# Patient Record
Sex: Male | Born: 1990 | Race: Black or African American | Hispanic: No | Marital: Single | Smoking: Never smoker
Health system: Southern US, Community
[De-identification: ages and names within clinical notes are randomized; demographics above are authoritative.]

## PROBLEM LIST (undated history)

## (undated) HISTORY — PX: EYE SURGERY: SHX253

---

## 2015-03-31 ENCOUNTER — Ambulatory Visit (INDEPENDENT_AMBULATORY_CARE_PROVIDER_SITE_OTHER): Payer: Self-pay | Admitting: Family Medicine

## 2015-03-31 VITALS — BP 120/78 | HR 89 | Temp 98.6°F | Resp 20 | Ht 68.5 in | Wt 146.0 lb

## 2015-03-31 DIAGNOSIS — Z021 Encounter for pre-employment examination: Secondary | ICD-10-CM

## 2015-03-31 DIAGNOSIS — Z111 Encounter for screening for respiratory tuberculosis: Secondary | ICD-10-CM

## 2015-03-31 NOTE — Patient Instructions (Signed)
Eating Healthy on a Budget There are many ways to save money at the grocery store and continue to eat healthy. You can be successful if you plan your meals according to your budget, purchase according to your budget and grocery list, and prepare food yourself.  How can I buy more food on a limited budget? Plan  Plan meals and snacks according to a grocery list and budget you create.  Look for recipes where you can cook once and make enough food for two meals.  Include meals that will "stretch" more expensive foods such as stews, casseroles, and stir-fry dishes.  Make a grocery list and make sure to bring it with you to the store. If you have a smart phone, you could use your phone to create your shopping list. Purchase  When grocery shopping, buy only the items on your grocery list and go only to the areas of the store that have the items on your list. Prepare  Some meal items can be prepared in advance. Pre-cook on days when you have extra time.  Make extra food (such as by doubling recipes) and freeze the extras in meal-sized containers or in individual portions for fast meals and snacks.  Use leftovers in your meal plan for the week.  Try some meatless meals or try "no cook" meals like salads.  When you come home from the grocery store, wash and prepare your fruits and vegetables so they are ready to use and eat. This will help reduce food waste. How can I buy more food on a limited budget?  Try these tips the next time you go shopping:   Buy store brands or generic brands.  Use coupons only for foods and brands you normally buy. Avoid buying items you wouldn't normally buy simply because they are on sale.  Check online and in newspapers for weekly deals.  Buy healthy items from the bulk bins when available, such as herbs, spices, flours, pastas, nuts, and dried fruit.  Buy fruits and vegetables that are in season. Prices are usually lower on in-season produce.  Compare and  contrast different items. You can do this by looking at the unit price on the price tag. Use it to compare different brands and sizes to find out which item is the best deal.  Choose naturally low-cost healthy items, such as carrots, potatoes, apples, bananas, and oranges. Dried or canned beans are a low-cost protein source.  Buy in bulk and freeze extra food. Items you can buy in bulk include meats, fish, poultry, frozen fruits, and frozen vegetables.  Limit the purchase of prepared or "ready-to-eat" foods, such as pre-cut fruits and vegetables and pre-made salads.  If possible, shop around to discover which grocery store offers the best prices. Some stores charge much more than other stores for the same items.  Do not shop when you are hungry. If you shop while hungry, It may be hard to stick to your list and budget.  Stick to your list and resist impulse buys. Treat your list as your official plan for the week.  Buy a variety of vegetables and fruit by purchasing fresh, frozen, and canned items.  Look beyond eye level. Foods at eye level (adult or child eye level) are more expensive. Look at the top and bottom shelves for deals.  Be efficient with your time when shopping. The more time you spend at the store, the more money you are likely to spend.  Consider other retailers such as dollar   stores, larger wholesale stores, local fruit and vegetable stands, and farmers markets. What are some tips for less expensive food substitutions? When choosing more expensive foods like meats and dairy, try these tips to save money:  Choose cheaper cuts of meat, such as bone-in chicken thighs and drumsticks instead skinless and boneless chicken. When you are ready to prepare the chicken, you can remove the skin yourself to make it healthier.  Choose lean meats like chicken or turkey. When choosing ground beef, make sure it is lean ground beef (92% lean, 8% fat). If you do buy a fattier ground beef,  drain the fat before eating.  Buy dried beans and peas, such as lentils, split peas, or kidney beans.  For seafood, choose canned tuna, salmon, or sardines.  Eggs are a low-cost source of protein.  Buy the larger tubs of yogurt instead of individual-sized containers.  Choose water instead of sodas and other sweetened beverages.  Skip buying chips, cookies, and other "junk food". These items are usually expensive, high in calories, and low in nutritional value. How can I prepare the foods I buy in the healthiest way? Practice these tips for cooking foods in the healthiest way to reduce excess fat and calorie intake:  Steam, saute, grill, or bake foods instead of frying them.  Make sure half your plate is filled with fruits or vegetables. Choose from fresh, frozen, or canned fruits and vegetables. If eating canned, remember to rinse them before eating. This will remove any excess salt added for packaging.  Trim all fat from meat before cooking. Remove the skin from chicken or turkey.  Spoon off fat from meat dishes once they have been chilled in the refrigerator and the fat has hardened on the top.  Use skim milk, low-fat milk, or evaporated skim milk when making cream sauces, soups, or puddings.  Substitute low-fat yogurt, sour cream, or cottage cheese for sour cream and mayonnaise in dips and dressings.  Try lemon juice, herbs, or spices to season food instead of salt, butter, or margarine.   This information is not intended to replace advice given to you by your health care provider. Make sure you discuss any questions you have with your health care provider.   Document Released: 11/03/2013 Document Reviewed: 11/03/2013 Elsevier Interactive Patient Education 2016 Elsevier Inc.    

## 2015-03-31 NOTE — Progress Notes (Signed)
Subjective:  By signing my name below, I, Raven Morton, attest that this documentation has been prepared under the direction and in the presence of Daniel SorensonEva Shaw, MD.  Electronically Signed: Andrew Auaven Morton, ED Scribe. 03/31/2015. 1:41 PM.   Patient ID: Daniel Morton, male    DOB: 16-Aug-1990, 25 y.o.   MRN: [id number]  HPI Chief Complaint  Patient presents with  . Annual Exam    tb test   HPI Comments: Daniel Morton is a 25 y.o. male who presents to the [facility] for an annual exam. 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  [place].   PMHx No hx of medical problems. He does not take prescription medication. He had a stye surgically removed from eye, but no other surgeries.   Immunization  Pt is UTD on Tetanus and states he is within 10 years range.  Eye care Pt does not wear contacts or glasses.   Family hx His father has hx of HTN. Mother is healthy   Pt is otherwise health. No complaints or concerns.   History reviewed. No pertinent past medical history. Past Surgical History  Procedure Laterality Date  . Eye surgery     Prior to Admission medications   Not on File   No Known Allergies History reviewed. No pertinent family history. Social History   Social History  . Marital Status: Single    Spouse Name: N/A  . Number of Children: N/A  . Years of Education: N/A   Social History Main Topics  . Smoking status: Never Smoker   . Smokeless tobacco: None  . Alcohol Use: No  . Drug Use: No  . Sexual Activity: Not Asked   Other Topics Concern  . None   Social History Narrative  . None    Review of Systems  All other systems reviewed and are negative.    Objective:   Physical Exam  Constitutional: He is oriented to person, place, and time. He appears well-developed and well-nourished. No distress.  HENT:  Head: Normocephalic and atraumatic.  Right Ear:  Hearing, tympanic membrane, external ear and ear canal normal.  Left Ear: Hearing, tympanic membrane, external ear and ear canal normal.  Nose: Nose normal.  Mouth/Throat: Uvula is midline, oropharynx is clear and moist and mucous membranes are normal.  Eyes: Conjunctivae and EOM are normal.  Neck: Normal range of motion. Neck supple. No thyromegaly present.  Cardiovascular: Normal rate, regular rhythm, S1 normal, S2 normal and normal heart sounds.  Exam reveals no gallop and no friction rub.   No murmur heard. Pulmonary/Chest: Effort normal and breath sounds normal. No respiratory distress. He has no decreased breath sounds. He has no wheezes. He has no rales. He exhibits no tenderness.  Musculoskeletal: Normal range of motion. He exhibits no tenderness.  Lymphadenopathy:    He has no cervical adenopathy.  Neurological: He is alert and oriented to person, place, and time.  Reflex Scores:      Patellar reflexes are 2+ on the right side and 2+ on the left side. Skin: Skin is warm and dry.  Psychiatric: He has a normal mood and affect. His behavior is normal.  Nursing note and vitals reviewed.  Filed Vitals:   03/31/15 1338  BP: 120/78  Pulse: 89  Temp: 98.6 F (37 C)  TempSrc: Oral  Resp: 20  Height: 5' 8.5" (1.74 m)  Weight: 146  lb (66.225 kg)  SpO2: 98%    Assessment & Plan:   1. Pre-employment health screening examination   2. Screening-pulmonary TB   No concerns or restrictions. Completed form for [employer] employment. Immunizations UTD per pt.  CDC screening/sxs questionnaire neg with no risk factors so no need for ppd skin test. (If does need ppd, ok to return for nurse visit for this only w/o OV co-pay)   I personally performed the services described in this documentation, which was scribed in my presence. The recorded information has been reviewed and considered, and addended by me as needed.  Daniel Sorenson, MD MPH

## 2015-03-31 NOTE — Progress Notes (Signed)

## 2015-07-04 ENCOUNTER — Emergency Department (HOSPITAL_COMMUNITY)
Admission: EM | Admit: 2015-07-04 | Discharge: 2015-07-05 | Disposition: A | Discharge status: Home / Self Care | Payer: BC Managed Care – PPO | Attending: Emergency Medicine | Admitting: Emergency Medicine

## 2015-07-04 ENCOUNTER — Emergency Department (HOSPITAL_COMMUNITY): Payer: BC Managed Care – PPO

## 2015-07-04 ENCOUNTER — Encounter (HOSPITAL_COMMUNITY): Payer: Self-pay | Admitting: Emergency Medicine

## 2015-07-04 DIAGNOSIS — F411 Generalized anxiety disorder: Secondary | ICD-10-CM

## 2015-07-04 DIAGNOSIS — F419 Anxiety disorder, unspecified: Secondary | ICD-10-CM | POA: Insufficient documentation

## 2015-07-04 DIAGNOSIS — R079 Chest pain, unspecified: Secondary | ICD-10-CM | POA: Diagnosis not present

## 2015-07-04 LAB — BASIC METABOLIC PANEL
ANION GAP: 13 (ref 5–15)
BUN: 9 mg/dL (ref 6–20)
CHLORIDE: 102 mmol/L (ref 101–111)
CO2: 26 mmol/L (ref 22–32)
Calcium: 9.4 mg/dL (ref 8.9–10.3)
Creatinine, Ser: 1 mg/dL (ref 0.61–1.24)
GFR calc Af Amer: >60 mL/min (ref >60)
GFR calc non Af Amer: >60 mL/min (ref >60)
Glucose, Bld: 113 mg/dL — ABNORMAL HIGH (ref 65–99)
POTASSIUM: 3.8 mmol/L (ref 3.5–5.1)
SODIUM: 141 mmol/L (ref 135–145)

## 2015-07-04 LAB — I-STAT TROPONIN, ED: Troponin i, poc: 0 ng/mL (ref 0.00–0.08)

## 2015-07-04 LAB — CBC
HEMATOCRIT: 44 % (ref 39.0–52.0)
HEMOGLOBIN: 14.2 g/dL (ref 13.0–17.0)
MCH: 26.6 pg (ref 26.0–34.0)
MCHC: 32.3 g/dL (ref 30.0–36.0)
MCV: 82.6 fL (ref 78.0–100.0)
Platelets: 249 10*3/uL (ref 150–400)
RBC: 5.33 MIL/uL (ref 4.22–5.81)
RDW: 12.6 % (ref 11.5–15.5)
WBC: 7.2 10*3/uL (ref 4.0–10.5)

## 2015-07-04 MED ORDER — NAPROXEN 250 MG PO TABS: 500.0000 mg | ORAL_TABLET | Freq: Once | ORAL | Status: DC

## 2015-07-04 NOTE — ED Notes (Signed)
Pt. reports central chest pain with mild SOB onset today , denies nausea or diaphoresis , no cough or congestion .

## 2015-07-04 NOTE — ED Provider Notes (Signed)
CSN: [id number]     Arrival date & time 07/04/15  2046 History   First MD Initiated Contact with Patient 07/04/15 2230     Chief Complaint  Patient presents with  . Chest Pain      HPI  NORVAL SLAVEN is an 25 y.o. otherwise healthy male who presents to the ED for evaluation of chest pain. 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 He states the pain is constant in his central chest. It does not radiate. He states that he currently feels a dull ache which is much improved from yesterday. Denies SOB or diaphoresis. Denies feeling faint or lightheaded. Denies leg pain or swelling. He did not try anything to alleviate his symptoms. Denies cardiac history or h/o blood clots. Denies significant fam hx. Denies smoking or recent travel.  History reviewed. No pertinent past medical history. Past Surgical History  Procedure Laterality Date  . Eye surgery     No family history on file. Social History  Substance Use Topics  . Smoking status: Never Smoker   . Smokeless tobacco: None  . Alcohol Use: No    Review of Systems  All other systems reviewed and are negative.     Allergies  Review of patient's allergies indicates no known allergies.  Home Medications   Prior to Admission medications   Not on File   BP 149/98 mmHg  Pulse 91  Temp(Src) 98.7 F (37.1 C) (Oral)  Resp 13  Ht  (1.727 m)  Wt 68.04 kg  BMI 22.81 kg/m2  SpO2 98% Physical Exam  Constitutional: He is oriented to person, place, and time. No distress.  HENT:  Right Ear: External ear normal.  Left Ear: External ear normal.  Nose: Nose normal.  Mouth/Throat: Oropharynx is clear and moist. No oropharyngeal exudate.  Eyes: Conjunctivae and EOM are normal. Pupils are equal, round, and reactive to light.  Neck: Normal range of motion. Neck supple.  Cardiovascular: Normal rate, regular rhythm, normal heart sounds and intact distal pulses.   Pulmonary/Chest: Effort normal and breath  sounds normal. No respiratory distress. He has no wheezes. He exhibits tenderness.    Abdominal: Soft. Bowel sounds are normal. He exhibits no distension. There is no tenderness. There is no rebound and no guarding.  Musculoskeletal: He exhibits no edema.  Neurological: He is alert and oriented to person, place, and time. No cranial nerve deficit.  Skin: Skin is warm and dry.  Psychiatric: His mood appears anxious.  Nursing note and vitals reviewed.   ED Course  Procedures (including critical care time) Labs Review Labs Reviewed  BASIC METABOLIC PANEL - Abnormal; Notable for the following:    Glucose, Bld 113 (*)    All other components within normal limits  CBC  I-STAT TROPOININ, ED    Imaging Review Dg Chest 2 View  07/04/2015  CLINICAL DATA:  Chest pain since yesterday. EXAM: CHEST  2 VIEW COMPARISON:  None. FINDINGS: The cardiomediastinal contours are normal. The lungs are clear. Pulmonary vasculature is normal. No consolidation, pleural effusion, or pneumothorax. No acute osseous abnormalities are seen. IMPRESSION: No acute pulmonary process. Electronically Signed   By: Rubye Oaks M.D.   On: 07/04/2015 21:31   I have personally reviewed and evaluated these images and lab results as part of my medical decision-making.   EKG Interpretation None      MDM   Final diagnoses:  Chest pain, unspecified chest pain type  Anxiety state  Labs unremarkable. CXR negative. Trop negative. EKG nonacute. Doubt ACS. HEART score 0. Doubt PE. PERC negative. The pain is reproducible with palpation. Suspect chest pain likely secondary to stress/anxiety vs msk pain. Pt endorses feeling nervous in the ED. Will give naproxen for pain and reassess. ANticipate d/c home.   11:56 PM pt stil has not receive naproxen due to multiple acute patients in the pod. He states pain is gone and he would like to go home. VSS. Instructed to f/u with PCP. Strict ER return precautions given.   Carlene CoriaSerena Y  Lilliah Priego, PA-C 07/04/15 2359  Geoffery Lyonsouglas Delo, MD 07/05/15 [phone]

## 2015-07-04 NOTE — Discharge Instructions (Signed)
You were seen in the ER today for evaluation of chest pain. Your labs, EKG, and chest x-ray were normal. I suspect your pain was due to stress. Return to the emergency room for worsening condition or new concerning symptoms. Follow up with your regular doctor. If you don't have a regular doctor use one of the numbers below to establish a primary care doctor.   Emergency Department Resource Guide 1) Find a Doctor and Pay Out of Pocket Although you won't have to find out who is covered by your insurance plan, it is a good idea to ask around and get recommendations. You will then need to call the office and see if the doctor you have chosen will accept you as a new patient and what types of options they offer for patients who are self-pay. Some doctors offer discounts or will set up payment plans for their patients who do not have insurance, but you will need to ask so you aren't surprised when you get to your appointment.  2) Contact Your Local Health Department Not all health departments have doctors that can see patients for sick visits, but many do, so it is worth a call to see if yours does. If you don't know where your local health department is, you can check in your phone book. The CDC also has a tool to help you locate your state's health department, and many state websites also have listings of all of their local health departments.  3) Find a Walk-in Clinic If your illness is not likely to be very severe or complicated, you may want to try a walk in clinic. These are popping up all over the country in pharmacies, drugstores, and shopping centers. They're usually staffed by nurse practitioners or physician assistants that have been trained to treat common illnesses and complaints. They're usually fairly quick and inexpensive. However, if you have serious medical issues or chronic medical problems, these are probably not your best option.  No Primary Care Doctor: - Call Health Connect at   [phone] - they can help you locate a primary care doctor that  accepts your insurance, provides certain services, etc. - Physician Referral [phone]- [phone]  Emergency Department Resource Guide 1) Find a Doctor and Pay Out of Pocket Although you won't have to find out who is covered by your insurance plan, it is a good idea to ask around and get recommendations. You will then need to call the office and see if the doctor you have chosen will accept you as a new patient and what types of options they offer for patients who are self-pay. Some doctors offer discounts or will set up payment plans for their patients who do not have insurance, but you will need to ask so you aren't surprised when you get to your appointment.  2) Contact Your Local Health Department Not all health departments have doctors that can see patients for sick visits, but many do, so it is worth a call to see if yours does. If you don't know where your local health department is, you can check in your phone book. The CDC also has a tool to help you locate your state's health department, and many state websites also have listings of all of their local health departments.  3) Find a Walk-in Clinic If your illness is not likely to be very severe or complicated, you may want to try a walk in clinic. These are popping up all over the country in pharmacies, drugstores, and  shopping centers. They're usually staffed by nurse practitioners or physician assistants that have been trained to treat common illnesses and complaints. They're usually fairly quick and inexpensive. However, if you have serious medical issues or chronic medical problems, these are probably not your best option.  No Primary Care Doctor: - Call Health Connect at  [phone] - they can help you locate a primary care doctor that  accepts your insurance, provides certain services, etc. - Physician Referral Service- [phone]  Chronic Pain  Problems: Organization         Address  Phone   Notes  [facility]  [phone] Patients need to be referred by their primary care doctor.   Medication Assistance: Organization         Address  Phone   Notes  [facility] Medication [facility] [address] [place], [place] [address] [phone] --Must be a resident of [facility] -- Must have NO insurance coverage whatsoever (no Medicaid/ Medicare, etc.) -- The pt. MUST have a primary care doctor that directs their care regularly and follows them in the community   [facility]  [phone]   [employer]  [phone]    Agencies that provide inexpensive medical care: Organization         Address  Phone   Notes  [place]  [phone]   [facility]    [phone]   [facility] [place], [place] [address] [phone]   [place] [address], [place] [phone]   [facility]    [phone]   [facility]    [phone]   [place] and [place] [address], [place] Phone:  [phone], Fax:  [phone] Hours of Operation:  9 am - 6 pm, M-F.  Also accepts Medicaid/Medicare and self-pay.  [facility] for [place], [address], [place] Phone: [phone], Fax: [phone]. Hours of Operation:  8:30 am - 5:30 pm, M-F.  Also accepts Medicaid and self-pay.  [facility] [place] [address] [place] Phone: [phone]   [place], [place], [place] [phone], Ext. [phone] Mondays & Thursdays: 7-9 AM.  First 15 patients are seen on a first come, first serve basis.    [place] Providers:  Organization         Address  Phone   Notes  [facility] [address],  [phone] Also  accepts self-pay patients.  [facility] [address], [place]  [phone]   [place], [address], [place] [phone]   [facility] [address], [place] [phone]   Lucianne Lei [address], [place]   [phone] Only accepts [place] patients after they have their name applied to their card.   Self-Pay (no insurance) in [facility]:  Organization         Address  Phone   Notes  Sickle Cell Patients, [facility] [place] [phone]   [facility] [place] [phone]   [facility] [place]  [place] [address] [place], [place] [phone]  [facility]/Dr. Osei-Bonsu  [address] [place] [address], [place] or [address], [place] [phone] Phone number for both [place] and [place] locations is the same.  [facility] [address], [place] [phone]   [facility] [place] [address] [place] [address], [place] or [address] [phone]   [facility] [address], [place] [phone], phone; [phone], fax Sees patients 1st and 3rd Saturday of every month.  Must not qualify for public or private insurance (i.e. Medicaid, Medicare, [place] Health Choice, Veterans' Benefits)  Household income should be no more than 200% of the poverty level The clinic cannot treat you if you are pregnant or think you are pregnant  Sexually transmitted diseases are not treated at the clinic.

## 2017-02-22 ENCOUNTER — Encounter (HOSPITAL_COMMUNITY): Payer: Self-pay | Admitting: Emergency Medicine

## 2017-02-22 ENCOUNTER — Ambulatory Visit (HOSPITAL_COMMUNITY)
Admission: EM | Admit: 2017-02-22 | Discharge: 2017-02-22 | Disposition: A | Discharge status: Home / Self Care | Payer: BC Managed Care – PPO | Attending: Family Medicine | Admitting: Family Medicine

## 2017-02-22 DIAGNOSIS — R300 Dysuria: Secondary | ICD-10-CM

## 2017-02-22 DIAGNOSIS — R369 Urethral discharge, unspecified: Secondary | ICD-10-CM

## 2017-02-22 LAB — POCT URINALYSIS DIP (DEVICE)
Bilirubin Urine: NEGATIVE
GLUCOSE, UA: NEGATIVE mg/dL
Hgb urine dipstick: NEGATIVE
Ketones, ur: NEGATIVE mg/dL
Leukocytes, UA: NEGATIVE
NITRITE: NEGATIVE
PROTEIN: NEGATIVE mg/dL
SPECIFIC GRAVITY, URINE: 1.015 (ref 1.005–1.030)
UROBILINOGEN UA: 0.2 mg/dL (ref 0.0–1.0)
pH: 8.5 — ABNORMAL HIGH (ref 5.0–8.0)

## 2017-02-22 MED ORDER — CEFTRIAXONE SODIUM 250 MG IJ SOLR
INTRAMUSCULAR | Status: AC
  Filled 2017-02-22: qty 250

## 2017-02-22 MED ORDER — CEFTRIAXONE SODIUM 250 MG IJ SOLR
250.0000 mg | Freq: Once | INTRAMUSCULAR | Status: AC
  Administered 2017-02-22: 250 mg via INTRAMUSCULAR

## 2017-02-22 MED ORDER — LIDOCAINE HCL (PF) 1 % IJ SOLN
INTRAMUSCULAR | Status: AC
  Filled 2017-02-22: qty 2

## 2017-02-22 MED ORDER — AZITHROMYCIN 250 MG PO TABS
ORAL_TABLET | ORAL | Status: AC
  Filled 2017-02-22: qty 4

## 2017-02-22 MED ORDER — AZITHROMYCIN 250 MG PO TABS
1000.0000 mg | ORAL_TABLET | Freq: Once | ORAL | Status: AC
  Administered 2017-02-22: 1000 mg via ORAL

## 2017-02-22 NOTE — ED Triage Notes (Signed)
PT reports dysuria and discharge for 2 days.

## 2017-02-22 NOTE — Discharge Instructions (Signed)
We have treated you for gonorrhea and chlamydia today. We will call you with the results and let you know if you need any further medication.   Please return if your symptoms are not improving, worsening. Monitor the bump for change, pain. May just be an ingrown hair.

## 2017-02-22 NOTE — ED Provider Notes (Signed)
[facility]    CSN: [id number] Arrival date & time: 02/22/17  1238     History   Chief Complaint Chief Complaint  Patient presents with  . Dysuria    HPI Daniel Morton is a 26 y.o. male presenting with 2 days of dysuria and discharge. Admits to having oral sex, sexual intercourse. Previously had similar symptoms and tested positive for STD's. Denies lesions to penis does endorse a hard bump on his left buttocks. Does endorse shaving. No known exposure to STD but wishes to go ahead and have treatment. Denies sore throat.  HPI  History reviewed. No pertinent past medical history.  There are no active problems to display for this patient.   Past Surgical History:  Procedure Laterality Date  . EYE SURGERY         Home Medications    Prior to Admission medications   Not on File    Family History No family history on file.  Social History Social History   Tobacco Use  . Smoking status: Never Smoker  . Smokeless tobacco: Never Used  Substance Use Topics  . Alcohol use: Yes    Alcohol/week: 0.0 oz  . Drug use: No     Allergies   Patient has no known allergies.   Review of Systems Review of Systems  Constitutional: Negative for appetite change, chills and fever.  HENT: Negative for congestion, rhinorrhea and sore throat.   Eyes: Negative for visual disturbance.  Respiratory: Negative for cough and shortness of breath.   Cardiovascular: Negative for chest pain and leg swelling.  Gastrointestinal: Negative for abdominal pain, diarrhea, nausea and vomiting.  Genitourinary: Positive for discharge and dysuria. Negative for genital sores, penile pain, penile swelling, scrotal swelling and testicular pain.  Musculoskeletal: Negative for arthralgias, back pain and myalgias.  Skin: Negative for color change and rash.  Neurological: Negative for dizziness, light-headedness, numbness and headaches.  Hematological: Negative for adenopathy.      Physical Exam Triage Vital Signs ED Triage Vitals  Enc Vitals Group     BP 02/22/17 1308 135/84     Pulse Rate 02/22/17 1308 82     Resp 02/22/17 1308 16     Temp 02/22/17 1308 98.8 F (37.1 C)     Temp Source 02/22/17 1308 Oral     SpO2 02/22/17 1308 100 %     Weight 02/22/17 1307 150 lb (68 kg)     Height 02/22/17 1307 5\' 8"  (1.727 m)     Head Circumference --      Peak Flow --      Pain Score 02/22/17 1307 2     Pain Loc --      Pain Edu? --      Excl. in GC? --    No data found.  Updated Vital Signs BP 135/84   Pulse 82   Temp 98.8 F (37.1 C) (Oral)   Resp 16   Ht 5\' 8"  (1.727 m)   Wt 150 lb (68 kg)   SpO2 100%   BMI 22.81 kg/m    Physical Exam  Constitutional: He is oriented to person, place, and time. He appears well-developed and well-nourished.  HENT:  Head: Normocephalic and atraumatic.  Mouth/Throat: Oropharynx is clear and moist.  Eyes: Conjunctivae are normal.  Neck: Normal range of motion. Neck supple.  Cardiovascular: Normal rate and regular rhythm.  No murmur heard. Pulmonary/Chest: Effort normal and breath sounds normal. No respiratory distress.  Abdominal: Soft. He exhibits no  distension. There is no tenderness.  Genitourinary: Penis normal. No penile tenderness.  Genitourinary Comments: No discharge visualized in urethral meatus, no lesions on penile shaft or groin.   2mm hard nodule located at bottom of left cheek, near rectum. Non tender. Non fluctuant.  Musculoskeletal: He exhibits no edema.  Neurological: He is alert and oriented to person, place, and time.  Skin: Skin is warm and dry. No rash noted.  Psychiatric: He has a normal mood and affect.  Nursing note and vitals reviewed.    UC Treatments / Results  Labs (all labs ordered are listed, but only abnormal results are displayed) Labs Reviewed  POCT URINALYSIS DIP (DEVICE) - Abnormal; Notable for the following components:      Result Value   pH 8.5 (*)    All other  components within normal limits  URINE CYTOLOGY ANCILLARY ONLY    EKG  EKG Interpretation None       Radiology No results found.  Procedures Procedures (including critical care time)  Medications Ordered in UC Medications  cefTRIAXone (ROCEPHIN) injection 250 mg (250 mg Intramuscular Given 02/22/17 1401)  azithromycin (ZITHROMAX) tablet 1,000 mg (1,000 mg Oral Given 02/22/17 1353)     Initial Impression / Assessment and Plan / UC Course  I have reviewed the triage vital signs and the nursing notes.  Pertinent labs & imaging results that were available during my care of the patient were reviewed by me and considered in my medical decision making (see chart for details).    Patient treated for gonorrhea and chlamydia per his request. Will call with culture results. Advised to return if symptoms not improving with treatment, abdominal pain, fever. Advised to monitor bump for change. Advised it may be an ingrown hair, may try warm compresses.  Final Clinical Impressions(s) / UC Diagnoses   Final diagnoses:  Dysuria    ED Discharge Orders    None       Controlled Substance Prescriptions [place] Controlled Substance Registry consulted? Not Applicable   Lew DawesWieters, Decorian Schuenemann C, [place]PA-C 02/22/17 [id number]

## 2017-02-23 LAB — URINE CYTOLOGY ANCILLARY ONLY
Chlamydia: NEGATIVE
Neisseria Gonorrhea: NEGATIVE

## 2017-07-19 IMAGING — DX DG CHEST 2V
2 series · 2 of 2 positions shown · non-contrast
Comparison: None.

CLINICAL DATA: Chest pain since yesterday.

EXAM:
CHEST  2 VIEW

[w chest pa]
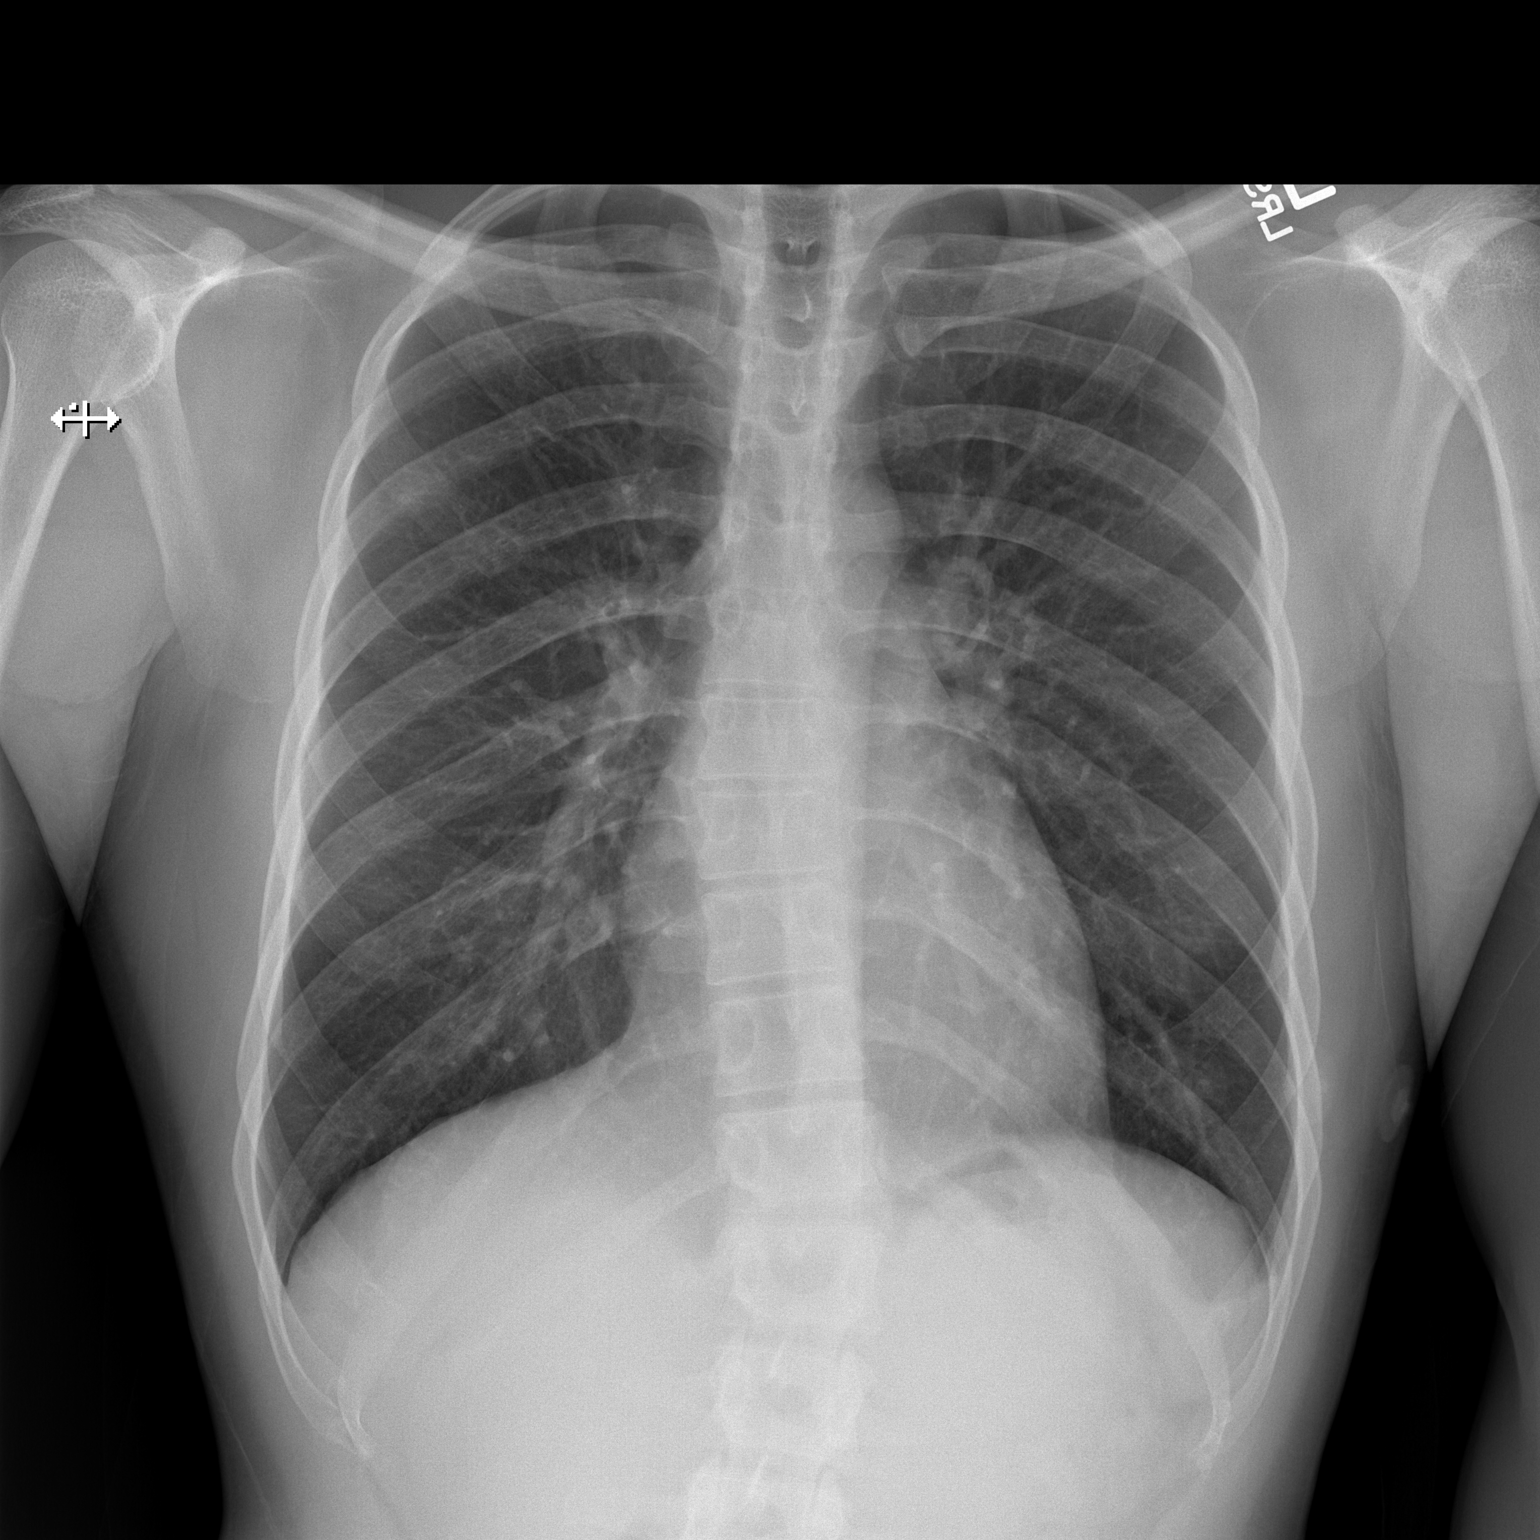

[w chest lat]
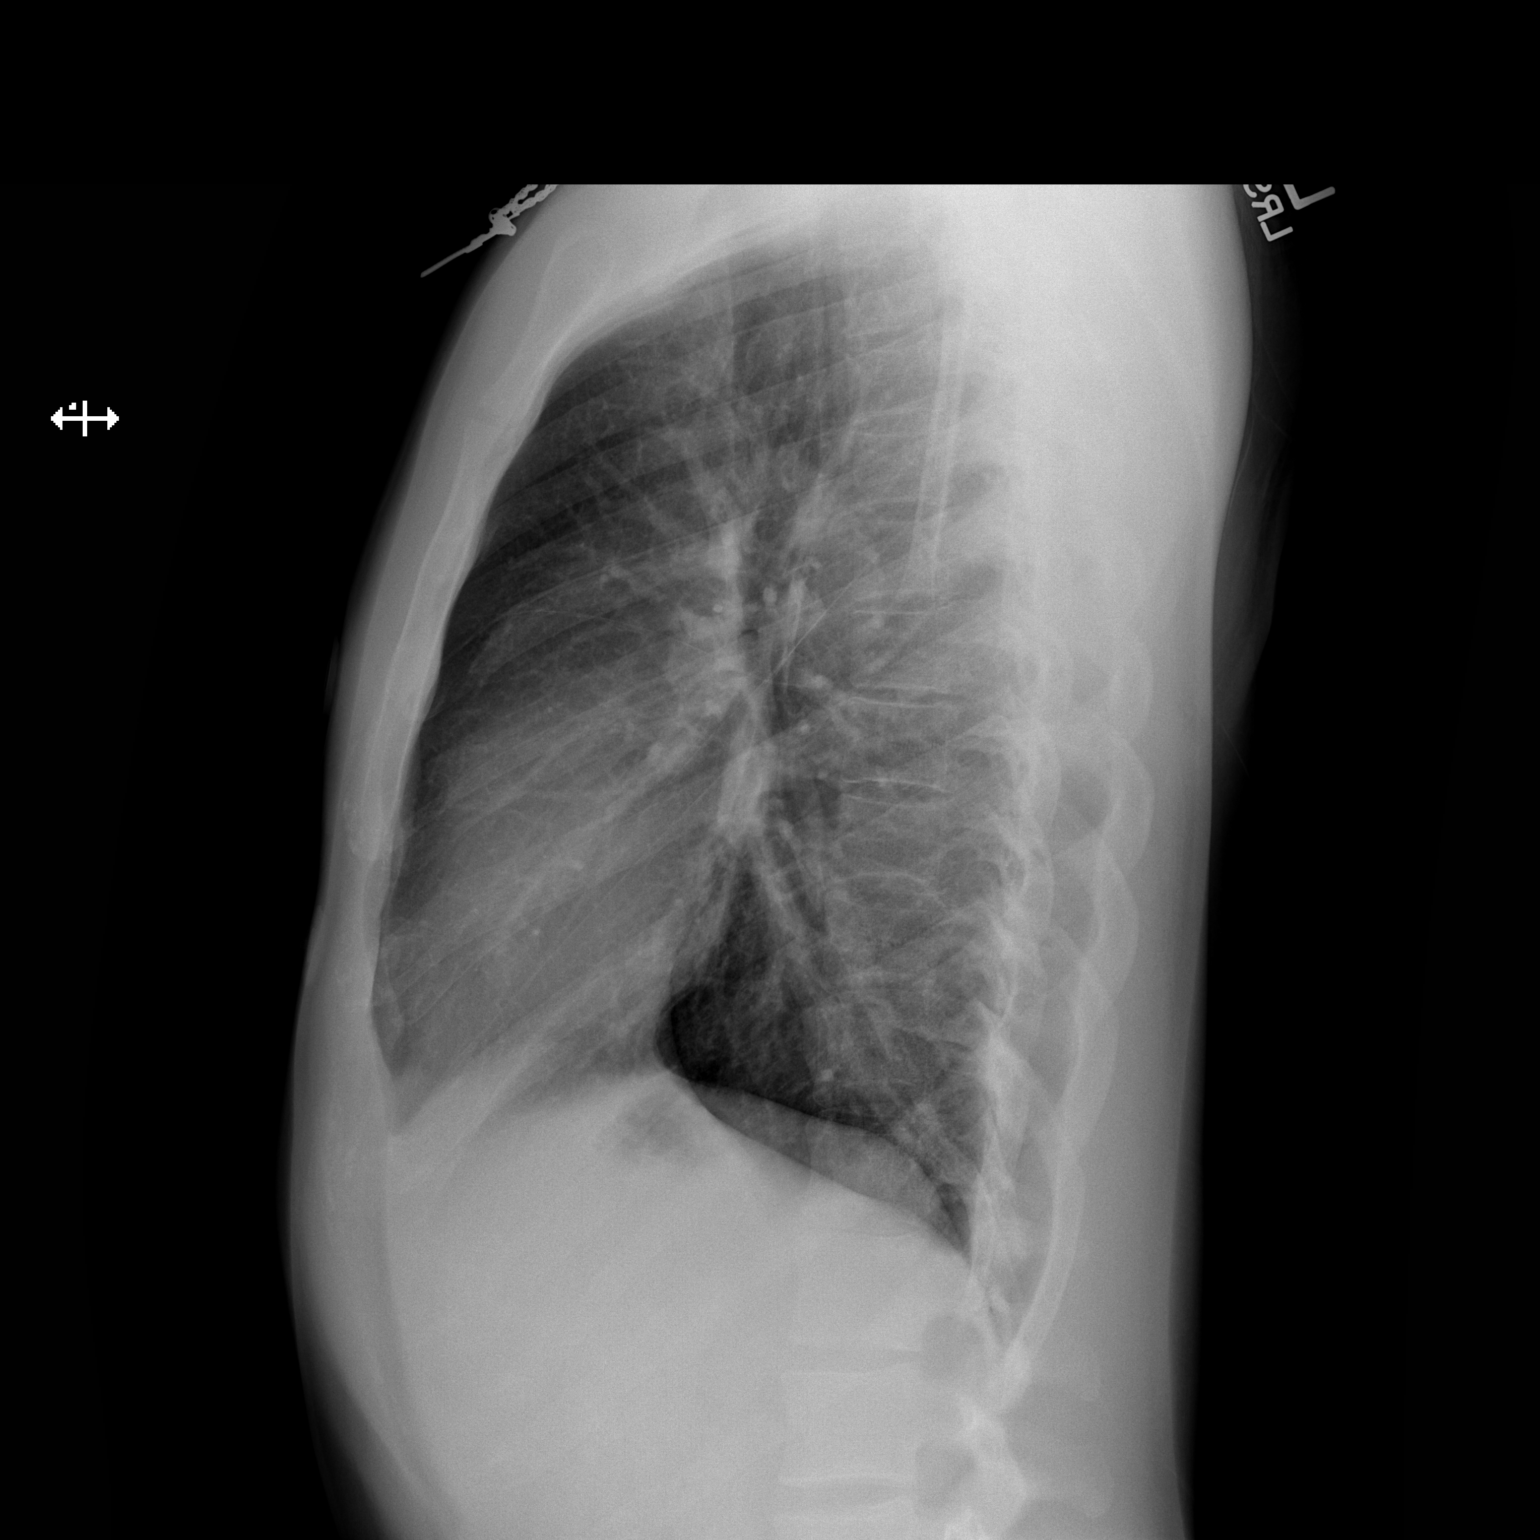

[2 of 2 positions shown; findings below may reference images not displayed]

FINDINGS: The cardiomediastinal contours are normal. The lungs are clear.
Pulmonary vasculature is normal. No consolidation, pleural effusion,
or pneumothorax. No acute osseous abnormalities are seen.
IMPRESSION: No acute pulmonary process.
